# Patient Record
Sex: Male | Born: 1980
Health system: Southern US, Community
[De-identification: ages and names within clinical notes are randomized; demographics above are authoritative.]

## PROBLEM LIST (undated history)

## (undated) DIAGNOSIS — T7840XA Allergy, unspecified, initial encounter: Secondary | ICD-10-CM

## (undated) DIAGNOSIS — F909 Attention-deficit hyperactivity disorder, unspecified type: Secondary | ICD-10-CM

## (undated) HISTORY — PX: SHOULDER SURGERY: SHX246

## (undated) HISTORY — PX: APPENDECTOMY: SHX54

## (undated) HISTORY — DX: Allergy, unspecified, initial encounter: T78.40XA

## (undated) HISTORY — DX: Attention-deficit hyperactivity disorder, unspecified type: F90.9

---
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
## 2000-11-28 ENCOUNTER — Other Ambulatory Visit: Admission: RE | Admit: 2000-11-28 | Discharge: 2000-11-28 | Payer: Self-pay | Admitting: Otolaryngology

## 2003-07-26 ENCOUNTER — Encounter: Admission: RE | Admit: 2003-07-26 | Discharge: 2003-07-26 | Payer: Self-pay | Admitting: Sports Medicine

## 2004-11-02 ENCOUNTER — Encounter: Admission: RE | Admit: 2004-11-02 | Discharge: 2004-11-02 | Payer: Self-pay | Admitting: Sports Medicine

## 2014-02-14 ENCOUNTER — Ambulatory Visit (INDEPENDENT_AMBULATORY_CARE_PROVIDER_SITE_OTHER): Payer: BC Managed Care – PPO | Admitting: Emergency Medicine

## 2014-02-14 VITALS — BP 124/80 | HR 88 | Temp 98.1°F | Resp 16 | Ht 75.0 in | Wt 244.8 lb

## 2014-02-14 DIAGNOSIS — F988 Other specified behavioral and emotional disorders with onset usually occurring in childhood and adolescence: Secondary | ICD-10-CM

## 2014-02-14 MED ORDER — AMPHETAMINE-DEXTROAMPHETAMINE 30 MG PO TABS: 30.0000 mg | ORAL_TABLET | Freq: Two times a day (BID) | ORAL | Status: DC

## 2014-02-14 NOTE — Progress Notes (Signed)
[facility] [address], [place] [id number] [phone]  Date:  02/14/2014   Name:  Thomas DuelSteven Bowers   DOB:  September 21, 1980   MRN:  [id number]  PCP:  No primary provider on file.    Chief Complaint: Medication Refill   History of Present Illness:  Thomas DuelSteven Cordial is a 33 y.o. very pleasant male patient who presents with the following:  Patient has a history of ADD, diagnosed in 2003 while an undergraduate.  Records of his learning disability evaluation are reviewed.  [place] has [place] from [place] and is requesting a refill on his adderall.  He is stable on the dose of 30 BID and has no adverse effect of treatment. He is eating and sleeping with no difficulty and maintaining his weight. No improvement with over the counter medications or other home remedies. Denies other complaint or health concern today.   There are no active problems to display for this patient.   Past Medical History  Diagnosis Date  . Allergy     History reviewed. No pertinent past surgical history.  History  Substance Use Topics  . Smoking status: Never Smoker   . Smokeless tobacco: Not on file  . Alcohol Use: Yes     Comment: occasional beer    History reviewed. No pertinent family history.  No Known Allergies  Medication list has been reviewed and updated.  No current outpatient prescriptions on file prior to visit.   No current facility-administered medications on file prior to visit.    Review of Systems:  As per HPI, otherwise negative.    Physical Examination: Filed Vitals:   02/14/14 1525  BP: 124/80  Pulse: 88  Temp: 98.1 F (36.7 C)  Resp: 16   Filed Vitals:   02/14/14 1525  Height: 6\' 3"  (1.905 m)  Weight: 244 lb 12.8 oz (111.041 kg)   Body mass index is 30.6 kg/(m^2). Ideal Body Weight: Weight in (lb) to have BMI = 25: 199.6  GEN: WDWN, NAD, Non-toxic, A & O x 3 HEENT: Atraumatic, Normocephalic. Neck supple. No masses, No LAD. Ears and Nose: No  external deformity. CV: RRR, No M/G/R. No JVD. No thrill. No extra heart sounds. PULM: CTA B, no wheezes, crackles, rhonchi. No retractions. No resp. distress. No accessory muscle use. ABD: S, NT, ND, +BS. No rebound. No HSM. EXTR: No c/c/e NEURO Normal gait.  PSYCH: Normally interactive. Conversant. Not depressed or anxious appearing.  Calm demeanor.    Assessment and Plan: ADD One month refill pending record review   Signed,  Phillips OdorJeffery Jhana Giarratano, MD

## 2014-02-14 NOTE — Patient Instructions (Signed)
Add Attention Deficit Hyperactivity Disorder Attention deficit hyperactivity disorder (ADHD) is a problem with behavior issues based on the way the brain functions (neurobehavioral disorder). It is a common reason for behavior and academic problems in school. SYMPTOMS  There are 3 types of ADHD. The 3 types and some of the symptoms include:  Inattentive.  Gets bored or distracted easily.  Loses or forgets things. Forgets to hand in homework.  Has trouble organizing or completing tasks.  Difficulty staying on task.  An inability to organize daily tasks and school work.  Leaving projects, chores, or homework unfinished.  Trouble paying attention or responding to details. Careless mistakes.  Difficulty following directions. Often seems like is not listening.  Dislikes activities that require sustained attention (like chores or homework).  Hyperactive-impulsive.  Feels like it is impossible to sit still or stay in a seat. Fidgeting with hands and feet.  Trouble waiting turn.  Talking too much or out of turn. Interruptive.  Speaks or acts impulsively.  Aggressive, disruptive behavior.  Constantly busy or on the go; noisy.  Often leaves seat when they are expected to remain seated.  Often runs or climbs where it is not appropriate, or feels very restless.  Combined.  Has symptoms of both of the above. Often children with ADHD feel discouraged about themselves and with school. They often perform well below their abilities in school. As children get older, the excess motor activities can calm down, but the problems with paying attention and staying organized persist. Most children do not outgrow ADHD but with good treatment can learn to cope with the symptoms. DIAGNOSIS  When ADHD is suspected, the diagnosis should be made by professionals trained in ADHD. This professional will collect information about the individual suspected of having ADHD. Information must be collected  from various settings where the person lives, works, or attends school.  Diagnosis will include:  Confirming symptoms began in childhood.  Ruling out other reasons for the child's behavior.  The health care providers will check with the child's school and check their medical records.  They will talk to teachers and parents.  Behavior rating scales for the child will be filled out by those dealing with the child on a daily basis. A diagnosis is made only after all information has been considered. TREATMENT  Treatment usually includes behavioral treatment, tutoring or extra support in school, and stimulant medicines. Because of the way a person's brain works with ADHD, these medicines decrease impulsivity and hyperactivity and increase attention. This is different than how they would work in a person who does not have ADHD. Other medicines used include antidepressants and certain blood pressure medicines. Most experts agree that treatment for ADHD should address all aspects of the person's functioning. Along with medicines, treatment should include structured classroom management at school. Parents should reward good behavior, provide constant discipline, and set limits. Tutoring should be available for the child as needed. ADHD is a lifelong condition. If untreated, the disorder can have long-term serious effects into adolescence and adulthood. HOME CARE INSTRUCTIONS   Often with ADHD there is a lot of frustration among family members dealing with the condition. Blame and anger are also feelings that are common. In many cases, because the problem affects the family as a whole, the entire family may need help. A therapist can help the family find better ways to handle the disruptive behaviors of the person with ADHD and promote change. If the person with ADHD is young, most of the  therapist's work is with the parents. Parents will learn techniques for coping with and improving their child's  behavior. Sometimes only the child with the ADHD needs counseling. Your health care providers can help you make these decisions.  Children with ADHD may need help learning how to organize. Some helpful tips include:  Keep routines the same every day from wake-up time to bedtime. Schedule all activities, including homework and playtime. Keep the schedule in a place where the person with ADHD will often see it. Mark schedule changes as far in advance as possible.  Schedule outdoor and indoor recreation.  Have a place for everything and keep everything in its place. This includes clothing, backpacks, and school supplies.  Encourage writing down assignments and bringing home needed books. Work with your child's teachers for assistance in organizing school work.  Offer your child a well-balanced diet. Breakfast that includes a balance of whole grains, protein, and fruits or vegetables is especially important for school performance. Children should avoid drinks with caffeine including:  Soft drinks.  Coffee.  Tea.  However, some older children (adolescents) may find these drinks helpful in improving their attention. Because it can also be common for adolescents with ADHD to become addicted to caffeine, talk with your health care provider about what is a safe amount of caffeine intake for your child.  Children with ADHD need consistent rules that they can understand and follow. If rules are followed, give small rewards. Children with ADHD often receive, and expect, criticism. Look for good behavior and praise it. Set realistic goals. Give clear instructions. Look for activities that can foster success and self-esteem. Make time for pleasant activities with your child. Give lots of affection.  Parents are their children's greatest advocates. Learn as much as possible about ADHD. This helps you become a stronger and better advocate for your child. It also helps you educate your child's teachers and  instructors if they feel inadequate in these areas. Parent support groups are often helpful. A national group with local chapters is called Children and Adults with Attention Deficit Hyperactivity Disorder (CHADD). SEEK MEDICAL CARE IF:  Your child has repeated muscle twitches, cough, or speech outbursts.  Your child has sleep problems.  Your child has a marked loss of appetite.  Your child develops depression.  Your child has new or worsening behavioral problems.  Your child develops dizziness.  Your child has a racing heart.  Your child has stomach pains.  Your child develops headaches. SEEK IMMEDIATE MEDICAL CARE IF:  Your child has been diagnosed with depression or anxiety and the symptoms seem to be getting worse.  Your child has been depressed and suddenly appears to have increased energy or motivation.  You are worried that your child is having a bad reaction to a medication he or she is taking for ADHD. Document Released: 06/25/2002 Document Revised: 07/10/2013 Document Reviewed: 03/12/2013 [facility] Patient Information 2015 [employer] [place]. This information is not intended to replace advice given to you by your health care provider. Make sure you discuss any questions you have with your health care provider.

## 2014-04-04 ENCOUNTER — Ambulatory Visit (INDEPENDENT_AMBULATORY_CARE_PROVIDER_SITE_OTHER): Payer: BC Managed Care – PPO | Admitting: Family Medicine

## 2014-04-04 VITALS — BP 138/90 | HR 73 | Temp 97.8°F | Resp 16 | Ht 75.0 in | Wt 247.8 lb

## 2014-04-04 DIAGNOSIS — F988 Other specified behavioral and emotional disorders with onset usually occurring in childhood and adolescence: Secondary | ICD-10-CM

## 2014-04-04 MED ORDER — AMPHETAMINE-DEXTROAMPHETAMINE 30 MG PO TABS: 30.0000 mg | ORAL_TABLET | Freq: Two times a day (BID) | ORAL | Status: DC

## 2014-04-04 NOTE — Patient Instructions (Signed)
Good to see you today- please come and see [place] in about 3 months when you are due for further refills

## 2014-04-04 NOTE — Progress Notes (Signed)
[facility] [address], [place] [address] [phone]  Date:  04/04/2014   Name:  Thomas Bowers   DOB:  02-Jun-1981   MRN:  [id number]  PCP:  No primary provider on file.    Chief Complaint: Medication Refill   History of Present Illness:  Thomas Bowers is a 33 y.o. very pleasant male patient who presents with the following:  He has been on adderall for many years, since he was in college.  He feels well with his current dose of medication, he is able to eat and sleep normally.  Would like a refill today.  Otherwise he is generally healthy and has no other concerns   There are no active problems to display for this patient.   Past Medical History  Diagnosis Date  . Allergy     History reviewed. No pertinent past surgical history.  History  Substance Use Topics  . Smoking status: Never Smoker   . Smokeless tobacco: Not on file  . Alcohol Use: Yes     Comment: occasional beer    History reviewed. No pertinent family history.  No Known Allergies  Medication list has been reviewed and updated.  Current Outpatient Prescriptions on File Prior to Visit  Medication Sig Dispense Refill  . amphetamine-dextroamphetamine (ADDERALL) 30 MG tablet Take 1 tablet (30 mg total) by mouth 2 (two) times daily.  60 tablet  0   No current facility-administered medications on file prior to visit.    Review of Systems:  As per HPI- otherwise negative.   Physical Examination: Filed Vitals:   04/04/14 1656  BP: 138/90  Pulse: 73  Temp: 97.8 F (36.6 C)  Resp: 16   Filed Vitals:   04/04/14 1656  Height:  (1.905 m)  Weight: 247 lb 12.8 oz (112.401 kg)   Body mass index is 30.97 kg/(m^2). Ideal Body Weight: Weight in (lb) to have BMI = 25: 199.6  GEN: WDWN, NAD, Non-toxic, A & O x 3, looks well, overweight HEENT: Atraumatic, Normocephalic. Neck supple. No masses, No LAD. Ears and Nose: No external deformity. CV: RRR, No M/G/R. No JVD. No  thrill. No extra heart sounds. PULM: CTA B, no wheezes, crackles, rhonchi. No retractions. No resp. distress. No accessory muscle use. EXTR: No c/c/e NEURO Normal gait.  PSYCH: Normally interactive. Conversant. Not depressed or anxious appearing.  Calm demeanor.    Assessment and Plan: ADD (attention deficit disorder) - Plan: amphetamine-dextroamphetamine (ADDERALL) 30 MG tablet, amphetamine-dextroamphetamine (ADDERALL) 30 MG tablet, amphetamine-dextroamphetamine (ADDERALL) 30 MG tablet  Refilled his adderall today.  He signed a release to have his old MD records sent to use but I do not see these yet.  Will look again when he comes to see [place] in 3 months    Signed Abbe Amsterdam, MD

## 2014-08-27 ENCOUNTER — Ambulatory Visit (INDEPENDENT_AMBULATORY_CARE_PROVIDER_SITE_OTHER): Payer: BLUE CROSS/BLUE SHIELD | Admitting: Physician Assistant

## 2014-08-27 ENCOUNTER — Telehealth: Payer: Self-pay | Admitting: Radiology

## 2014-08-27 VITALS — BP 142/88 | HR 94 | Temp 98.4°F | Resp 20 | Ht 75.5 in | Wt 264.5 lb

## 2014-08-27 DIAGNOSIS — F988 Other specified behavioral and emotional disorders with onset usually occurring in childhood and adolescence: Secondary | ICD-10-CM | POA: Insufficient documentation

## 2014-08-27 DIAGNOSIS — F909 Attention-deficit hyperactivity disorder, unspecified type: Secondary | ICD-10-CM

## 2014-08-27 MED ORDER — AMPHETAMINE-DEXTROAMPHETAMINE 30 MG PO TABS: 30.0000 mg | ORAL_TABLET | Freq: Two times a day (BID) | ORAL | Status: DC

## 2014-08-27 NOTE — Progress Notes (Signed)
Subjective:    Patient ID: Thomas Bowers, male    DOB: 21-Oct-1980, 34 y.o.   MRN: [id number]  HPI  This is a 34 year old male presenting for ADD follow up.  He has been on adderall 30 mg BID x 10 years. He doesn't take it on weekends. This dose works well for him. He is not having any insomnia, decreased appetite, or palpitations. He was to have the medical office that did formal testing on him 10 years ago send in the paperwork. He has tried to get this done twice but we still do not have records.  4-5 months ago [place]. 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 His wife is an Associate Professororthodontist. His mood is good, no anxiety.  Review of Systems  Constitutional: Negative for fever, chills and appetite change.  Cardiovascular: Negative for chest pain and palpitations.  Skin: Negative for rash.  Psychiatric/Behavioral: Negative for sleep disturbance. The patient is not nervous/anxious.    Patient Active Problem List   Diagnosis Date Noted  . ADD (attention deficit disorder) 08/27/2014   Prior to Admission medications   Medication Sig Start Date End Date Taking? Authorizing Provider  amphetamine-dextroamphetamine (ADDERALL) 30 MG tablet Take 1 tablet by mouth 2 (two) times daily. 08/27/14   Dorna LeitzNicole Hila Bolding V, PA-C  amphetamine-dextroamphetamine (ADDERALL) 30 MG tablet Take 1 tablet by mouth 2 (two) times daily. To fill 30 days after rx 08/27/14   Dorna LeitzNicole Tais Koestner V, PA-C  amphetamine-dextroamphetamine (ADDERALL) 30 MG tablet Take 1 tablet by mouth 2 (two) times daily. To fill 60 days after rx 08/27/14   Dorna LeitzNicole Dhillon [place] V, PA-C   No Known Allergies  Patient's social and family history were reviewed.     Objective:   Physical Exam  Constitutional: He is oriented to person, place, and time. He appears well-developed and well-nourished. No distress.  HENT:  Head: Normocephalic and atraumatic.  Right Ear: Hearing normal.  Left Ear: Hearing normal.  Nose:  Nose normal.  Eyes: Conjunctivae and lids are normal. Right eye exhibits no discharge. Left eye exhibits no discharge. No scleral icterus.  Cardiovascular: Normal rate, regular rhythm, normal heart sounds, intact distal pulses and normal pulses.   No murmur heard. Pulmonary/Chest: Effort normal and breath sounds normal. No respiratory distress. He has no wheezes. He has no rhonchi. He has no rales.  Musculoskeletal: Normal range of motion.  Neurological: He is alert and oriented to person, place, and time.  Skin: Skin is warm, dry and intact. No lesion and no rash noted.  Psychiatric: He has a normal mood and affect. His speech is normal and behavior is normal. Thought content normal.   BP 142/88 mmHg  Pulse 94  Temp(Src) 98.4 F (36.9 C) (Oral)  Resp 20  Ht 6' 3.5" (1.918 m)  Wt 264 lb 8 oz (119.976 kg)  BMI 32.61 kg/m2  SpO2 99%     Assessment & Plan:  1. ADD (attention deficit disorder) Refilled three months of adderall. We discussed if he wishes to establish care here he should establish with one provider. He should make an appt or call to see when one of us is working. He will return in 3 months for follow up.  - amphetamine-dextroamphetamine (ADDERALL) 30 MG tablet; Take 1 tablet by mouth 2 (two) times daily.  Dispense: 60 tablet; Refill: 0 - amphetamine-dextroamphetamine (ADDERALL) 30 MG tablet; Take 1 tablet by mouth 2 (two) times daily. To fill 30  days after rx  Dispense: 60 tablet; Refill: 0 - amphetamine-dextroamphetamine (ADDERALL) 30 MG tablet; Take 1 tablet by mouth 2 (two) times daily. To fill 60 days after rx  Dispense: 60 tablet; Refill: 0   Roswell Miners. Dyke Brackett, MHS [facility]  08/27/2014

## 2014-08-27 NOTE — Patient Instructions (Signed)
Return in 3 months for follow-up  

## 2014-08-27 NOTE — Telephone Encounter (Signed)
Medical records- please try to find this patient's [facility]  He said he filled out a record release in 2015 and these documents should have been sent, I could not locate them in his chart or the "to be scanned" boxes.  The office notified him that the records had been faxed to us.  The office Phone numbers are #[phone], [phone], and [phone] if you need to reach them.  We need his ADD eval.   When found, please scan immediately.

## 2014-08-28 NOTE — Telephone Encounter (Signed)
Medical records does not have these records, nor do we have record of the request. He can have that office resend the records.

## 2014-08-28 NOTE — Progress Notes (Signed)
  Medical screening examination/treatment/procedure(s) were performed by non-physician practitioner and as supervising physician I was immediately available for consultation/collaboration.     

## 2014-08-31 NOTE — Telephone Encounter (Signed)
Left message on machine to call back  

## 2014-09-03 NOTE — Telephone Encounter (Signed)
Thomas Bowers still nothing on this pt?

## 2014-09-04 NOTE — Telephone Encounter (Signed)
Left message for [facility] to call back to see if they can resend evaluation.

## 2014-09-04 NOTE — Telephone Encounter (Signed)
Spoke with a lady from [facility] and she states he had his evaluation in 1997 and she would have to contct storage to release the paperwork to us. She states she is going to work on this and get back to me. It looks like Dr. Patsy Lageropland requested these records so we can have them in our file.

## 2014-09-04 NOTE — Telephone Encounter (Signed)
The only thing I've seen from 2015 is an [facility] that was scanned in Jan 2016. Is that what he's talking about?

## 2014-12-09 ENCOUNTER — Other Ambulatory Visit: Payer: Self-pay | Admitting: *Deleted

## 2014-12-09 ENCOUNTER — Ambulatory Visit
Admission: RE | Admit: 2014-12-09 | Discharge: 2014-12-09 | Disposition: A | Discharge status: Home / Self Care | Payer: BLUE CROSS/BLUE SHIELD | Source: Ambulatory Visit | Attending: *Deleted | Admitting: *Deleted

## 2014-12-09 DIAGNOSIS — R1031 Right lower quadrant pain: Secondary | ICD-10-CM

## 2014-12-09 MED ORDER — IOHEXOL 300 MG/ML  SOLN
125.0000 mL | Freq: Once | INTRAMUSCULAR | Status: AC | PRN
  Administered 2014-12-09: 125 mL via INTRAVENOUS

## 2015-01-09 ENCOUNTER — Ambulatory Visit (INDEPENDENT_AMBULATORY_CARE_PROVIDER_SITE_OTHER): Payer: BLUE CROSS/BLUE SHIELD | Admitting: Emergency Medicine

## 2015-01-09 VITALS — BP 110/86 | HR 75 | Temp 98.5°F | Resp 16 | Ht 75.5 in | Wt 263.6 lb

## 2015-01-09 DIAGNOSIS — F988 Other specified behavioral and emotional disorders with onset usually occurring in childhood and adolescence: Secondary | ICD-10-CM

## 2015-01-09 DIAGNOSIS — F909 Attention-deficit hyperactivity disorder, unspecified type: Secondary | ICD-10-CM

## 2015-01-09 MED ORDER — AMPHETAMINE-DEXTROAMPHETAMINE 30 MG PO TABS: 30.0000 mg | ORAL_TABLET | Freq: Two times a day (BID) | ORAL | Status: DC

## 2015-01-09 MED ORDER — AMPHETAMINE-DEXTROAMPHETAMINE 30 MG PO TABS: 30.0000 mg | ORAL_TABLET | Freq: Two times a day (BID) | ORAL | Status: AC

## 2015-01-09 NOTE — Patient Instructions (Signed)
Amphetamine; Dextroamphetamine extended-release capsules What is this medicine? AMPHETAMINE; DEXTROAMPHETAMINE (am FET a meen; dex troe am FET a meen) is used to treat attention-deficit hyperactivity disorder (ADHD). Federal law prohibits giving this medicine to any person other than the person for whom it was prescribed. Do not share this medicine with anyone else. This medicine may be used for other purposes; ask your health care provider or pharmacist if you have questions. COMMON BRAND NAME(S): Adderall XR What should I tell my health care provider before I take this medicine? They need to know if you have any of these conditions: -anxiety or panic attacks -circulation problems in fingers and toes -glaucoma -hardening or blockages of the arteries or heart blood vessels -heart disease or a heart defect -high blood pressure -history of a drug or alcohol abuse problem -history of stroke -kidney disease -liver disease -mental illness -seizures -suicidal thoughts, plans, or attempt; a previous suicide attempt by you or a family member -thyroid disease -Tourette's syndrome -an unusual or allergic reaction to dextroamphetamine, other amphetamines, other medicines, foods, dyes, or preservatives -pregnant or trying to get pregnant -breast-feeding How should I use this medicine? Take this medicine by mouth with a glass of water. Follow the directions on the prescription label. This medicine is taken just one time per day, usually in the morning after waking up. Take with or without food. Do not chew or crush this medicine. You may open the capsules and sprinkle the medicine onto a spoon of applesauce. If sprinkled on applesauce, take the dose immediately and do not crush or chew. Always drink a glass of water or other liquid after taking this medicine. Do not take your medicine more often than directed. A special MedGuide will be given to you by the pharmacist with each prescription and refill.  Be sure to read this information carefully each time. Talk to your pediatrician regarding the use of this medicine in children. While this drug may be prescribed for children as young as 6 years for selected conditions, precautions do apply. Overdosage: If you think you have taken too much of this medicine contact a poison control center or emergency room at once. NOTE: This medicine is only for you. Do not share this medicine with others. What if I miss a dose? If you miss a dose, take it as soon as you can. If it is almost time for your next dose, take only that dose. Do not take double or extra doses. What may interact with this medicine? Do not take this medicine with any of the following medications: -certain medicines for migraine headache like almotriptan, eletriptan, frovatriptan, naratriptan, rizatriptan, sumatriptan, zolmitriptan -MAOIs like Carbex, Eldepryl, Marplan, Nardil, and Parnate -meperidine -other stimulant medicines for attention disorders, weight loss, or to stay awake -pimozide -procarbazine This medicine may also interact with the following medications: -acetazolamide -ammonium chloride -antacids -ascorbic acid -atomoxetine -caffeine -certain medicines for blood pressure -certain medicines for depression, anxiety, or psychotic disturbances -certain medicines for diabetes -certain medicines for seizures like carbamazepine, phenobarbital, phenytoin -certain medicines for stomach problems like cimetidine, famotidine, omeprazole, lansoprazole -cold or allergy medicines -glutamic acid -methenamine -narcotic medicines for pain -norepinephrine -phenothiazines like chlorpromazine, mesoridazine, prochlorperazine, thioridazine -sodium acid phosphate -sodium bicarbonate This list may not describe all possible interactions. Give your health care provider a list of all the medicines, herbs, non-prescription drugs, or dietary supplements you use. Also tell them if you  smoke, drink alcohol, or use illegal drugs. Some items may interact with your medicine. What should   I watch for while using this medicine? Visit your doctor or health care professional for regular checks on your progress. This prescription requires that you follow special procedures with your doctor and pharmacy. You will need to have a new written prescription from your doctor every time you need a refill. This medicine may affect your concentration, or hide signs of tiredness. Until you know how this medicine affects you, do not drive, ride a bicycle, use machinery, or do anything that needs mental alertness. Tell your doctor or health care professional if this medicine loses its effects, or if you feel you need to take more than the prescribed amount. Do not change the dosage without talking to your doctor or health care professional. Decreased appetite is a common side effect when starting this medicine. Eating small, frequent meals or snacks can help. Talk to your doctor if you continue to have poor eating habits. Height and weight growth of a child taking this medicine will be monitored closely. Do not take this medicine close to bedtime. It may prevent you from sleeping. If you are going to need surgery, an MRI, a CT scan, or other procedure, tell your doctor that you are taking this medicine. You may need to stop taking this medicine before the procedure. Tell your doctor or healthcare professional right away if you notice unexplained wounds on your fingers and toes while taking this medicine. You should also tell your healthcare provider if you experience numbness or pain, changes in the skin color, or sensitivity to temperature in your fingers or toes. What side effects may I notice from receiving this medicine? Side effects that you should report to your doctor or health care professional as soon as possible: -allergic reactions like skin rash, itching or hives, swelling of the face, lips, or  tongue -changes in vision -chest pain or chest tightness -confusion, trouble speaking or understanding -fast, irregular heartbeat -fingers or toes feel numb, cool, painful -hallucination, loss of contact with reality -high blood pressure -males: prolonged or painful erection -seizures -severe headaches -shortness of breath -suicidal thoughts or other mood changes -trouble walking, dizziness, loss of balance or coordination -uncontrollable head, mouth, neck, arm, or leg movements Side effects that usually do not require medical attention (report to your doctor or health care professional if they continue or are bothersome): -anxious -headache -loss of appetite -nausea, vomiting -trouble sleeping -weight loss This list may not describe all possible side effects. Call your doctor for medical advice about side effects. You may report side effects to FDA at 1-800-FDA-1088. Where should I keep my medicine? Keep out of the reach of children. This medicine can be abused. Keep your medicine in a safe place to protect it from theft. Do not share this medicine with anyone. Selling or giving away this medicine is dangerous and against the law. Store at room temperature between 15 and 30 degrees C (59 and 86 degrees F). Keep container tightly closed. Protect from light. Throw away any unused medicine after the expiration date. NOTE: This sheet is a summary. It may not cover all possible information. If you have questions about this medicine, talk to your doctor, pharmacist, or health care provider.  2015, Elsevier/Gold Standard. (2013-05-18 18:16:00)  

## 2015-01-09 NOTE — Progress Notes (Signed)
Subjective:  Patient ID: Thomas Bowers, male    DOB: 03/08/1981  Age: 34 y.o. MRN: [id number]  CC: Medication Refill and Follow-up   HPI Thomas Bowers presents  for refill of his Adderall. Has been taking now for about 6 months is doing well with the current dose. His eating and sleeping well is maintaining his weight. His symptoms of ADD  are well-controlled. He has requested his medical records be sent here on 1 prior occasion they've not yet arrived.  History Thomas Bowers has a past medical history of Allergy.   He has past surgical history that includes Shoulder surgery (Left) and Appendectomy.   His  family history is not on file.  He   reports that he has never smoked. He does not have any smokeless tobacco history on file. He reports that he drinks alcohol. He reports that he does not use illicit drugs.  Outpatient Prescriptions Prior to Visit  Medication Sig Dispense Refill  . amphetamine-dextroamphetamine (ADDERALL) 30 MG tablet Take 1 tablet by mouth 2 (two) times daily. 60 tablet 0  . amphetamine-dextroamphetamine (ADDERALL) 30 MG tablet Take 1 tablet by mouth 2 (two) times daily. To fill 30 days after rx (Patient not taking: Reported on 01/09/2015) 60 tablet 0  . amphetamine-dextroamphetamine (ADDERALL) 30 MG tablet Take 1 tablet by mouth 2 (two) times daily. To fill 60 days after rx (Patient not taking: Reported on 01/09/2015) 60 tablet 0   No facility-administered medications prior to visit.    History   Social History  . Marital Status: Married    Spouse Name: N/A  . Number of Children: N/A  . Years of Education: N/A   Social History Main Topics  . Smoking status: Never Smoker   . Smokeless tobacco: Not on file  . Alcohol Use: Yes     Comment: occasional beer  . Drug Use: No  . Sexual Activity: Not on file   Other Topics Concern  . None   Social History Narrative     Review of Systems  Constitutional: Negative for fever, chills and appetite change.  HENT:  Negative for congestion, ear pain, postnasal drip, sinus pressure and sore throat.   Eyes: Negative for pain and redness.  Respiratory: Negative for cough, shortness of breath and wheezing.   Cardiovascular: Negative for leg swelling.  Gastrointestinal: Negative for nausea, vomiting, abdominal pain, diarrhea, constipation and blood in stool.  Endocrine: Negative for polyuria.  Genitourinary: Negative for dysuria, urgency, frequency and flank pain.  Musculoskeletal: Negative for gait problem.  Skin: Negative for rash.  Neurological: Negative for weakness and headaches.  Psychiatric/Behavioral: Negative for confusion and decreased concentration. The patient is not nervous/anxious.     Objective:  BP 110/86 mmHg  Pulse 75  Temp(Src) 98.5 F (36.9 C) (Oral)  Resp 16  Ht 6' 3.5" (1.918 m)  Wt 263 lb 9.6 oz (119.568 kg)  BMI 32.50 kg/m2  SpO2 98%  Physical Exam  Constitutional: He is oriented to person, place, and time. He appears well-developed and well-nourished. No distress.  HENT:  Head: Normocephalic and atraumatic.  Right Ear: External ear normal.  Left Ear: External ear normal.  Nose: Nose normal.  Eyes: Conjunctivae and EOM are normal. Pupils are equal, round, and reactive to light. No scleral icterus.  Neck: Normal range of motion. Neck supple. No tracheal deviation present.  Cardiovascular: Normal rate, regular rhythm and normal heart sounds.   Pulmonary/Chest: Effort normal. No respiratory distress. He has no wheezes. He has no rales.  Abdominal: He exhibits no mass. There is no tenderness. There is no rebound and no guarding.  Musculoskeletal: He exhibits no edema.  Lymphadenopathy:    He has no cervical adenopathy.  Neurological: He is alert and oriented to person, place, and time. Coordination normal.  Skin: Skin is warm and dry. No rash noted.  Psychiatric: He has a normal mood and affect. His behavior is normal.      Assessment & Plan:   Thomas Bowers was seen today  for medication refill and follow-up.  Diagnoses and all orders for this visit:  ADD (attention deficit disorder) Orders: -     amphetamine-dextroamphetamine (ADDERALL) 30 MG tablet; Take 1 tablet by mouth 2 (two) times daily. To fill 60 days after rx -     amphetamine-dextroamphetamine (ADDERALL) 30 MG tablet; Take 1 tablet by mouth 2 (two) times daily. To fill 30 days after rx -     amphetamine-dextroamphetamine (ADDERALL) 30 MG tablet; Take 1 tablet by mouth 2 (two) times daily.   I am having Thomas Bowers maintain his amphetamine-dextroamphetamine, amphetamine-dextroamphetamine, and amphetamine-dextroamphetamine.  Meds ordered this encounter  Medications  . amphetamine-dextroamphetamine (ADDERALL) 30 MG tablet    Sig: Take 1 tablet by mouth 2 (two) times daily. To fill 60 days after rx    Dispense:  60 tablet    Refill:  0  . amphetamine-dextroamphetamine (ADDERALL) 30 MG tablet    Sig: Take 1 tablet by mouth 2 (two) times daily. To fill 30 days after rx    Dispense:  60 tablet    Refill:  0  . amphetamine-dextroamphetamine (ADDERALL) 30 MG tablet    Sig: Take 1 tablet by mouth 2 (two) times daily.    Dispense:  60 tablet    Refill:  0   I explained the office policy on controlled substances and instructed him that if we cannot obtain medical records substantiating his need for Adderall were not going to continue to prescribe it.  Appropriate red flag conditions were discussed with the patient as well as actions that should be taken.  Patient expressed his understanding.  Follow-up: Return in about 3 months (around 04/11/2015).  Carmelina Dane, MD

## 2015-01-21 ENCOUNTER — Ambulatory Visit: Payer: BLUE CROSS/BLUE SHIELD | Admitting: Family Medicine

## 2015-07-28 ENCOUNTER — Other Ambulatory Visit: Payer: Self-pay | Admitting: *Deleted

## 2015-07-28 ENCOUNTER — Ambulatory Visit
Admission: RE | Admit: 2015-07-28 | Discharge: 2015-07-28 | Disposition: A | Discharge status: Home / Self Care | Payer: BLUE CROSS/BLUE SHIELD | Source: Ambulatory Visit | Attending: *Deleted | Admitting: *Deleted

## 2015-07-28 DIAGNOSIS — W19XXXA Unspecified fall, initial encounter: Secondary | ICD-10-CM

## 2016-07-19 DIAGNOSIS — B338 Other specified viral diseases: Secondary | ICD-10-CM | POA: Diagnosis not present

## 2017-01-14 ENCOUNTER — Other Ambulatory Visit: Payer: Self-pay | Admitting: Family Medicine

## 2017-01-14 ENCOUNTER — Ambulatory Visit
Admission: RE | Admit: 2017-01-14 | Discharge: 2017-01-14 | Disposition: A | Discharge status: Home / Self Care | Payer: BLUE CROSS/BLUE SHIELD | Source: Ambulatory Visit | Attending: Family Medicine | Admitting: Family Medicine

## 2017-01-14 DIAGNOSIS — S99922A Unspecified injury of left foot, initial encounter: Secondary | ICD-10-CM | POA: Diagnosis not present

## 2017-01-14 DIAGNOSIS — M79672 Pain in left foot: Secondary | ICD-10-CM

## 2017-05-19 HISTORY — PX: TRANSTHORACIC ECHOCARDIOGRAM: SHX275

## 2017-05-23 ENCOUNTER — Other Ambulatory Visit: Payer: Self-pay | Admitting: Family Medicine

## 2017-05-23 ENCOUNTER — Ambulatory Visit
Admission: RE | Admit: 2017-05-23 | Discharge: 2017-05-23 | Disposition: A | Discharge status: Home / Self Care | Payer: BLUE CROSS/BLUE SHIELD | Source: Ambulatory Visit | Attending: Family Medicine | Admitting: Family Medicine

## 2017-05-23 DIAGNOSIS — R0602 Shortness of breath: Secondary | ICD-10-CM | POA: Diagnosis not present

## 2017-05-23 DIAGNOSIS — R0600 Dyspnea, unspecified: Secondary | ICD-10-CM

## 2017-05-25 ENCOUNTER — Ambulatory Visit: Payer: BLUE CROSS/BLUE SHIELD | Admitting: Cardiology

## 2017-05-25 ENCOUNTER — Encounter: Payer: Self-pay | Admitting: Cardiology

## 2017-05-25 VITALS — BP 122/92 | HR 88 | Ht 75.5 in | Wt 259.4 lb

## 2017-05-25 DIAGNOSIS — R0602 Shortness of breath: Secondary | ICD-10-CM | POA: Insufficient documentation

## 2017-05-25 DIAGNOSIS — R079 Chest pain, unspecified: Secondary | ICD-10-CM | POA: Diagnosis not present

## 2017-05-25 DIAGNOSIS — R9431 Abnormal electrocardiogram [ECG] [EKG]: Secondary | ICD-10-CM | POA: Diagnosis not present

## 2017-05-25 NOTE — Progress Notes (Signed)
PCP: Ailene RavelHamrick, Maura L, MD  Clinic Note: Chief Complaint  Patient presents with  . New Patient (Initial Visit)  . Chest Pain    HPI: Thomas Bowers is a 36 y.o. male who is being seen today for the evaluation of chest pain and shortness of breath at the request of Hamrick, Durward FortesMaura L, MD.  Thomas Bowers was last seen on a couple occasions here in early November by his PCP.  Was seen on the fifth and 6 November.  Initially came in with a couple weeks of intermittent spells of tightness across the lower chest radiating to both legs associated with significant dyspnea.  He also notes palpitations.  Recent Hospitalizations: None  Studies Personally Reviewed - (if available, images/films reviewed: From Epic Chart or Care Everywhere)  EKG from PCP shows sinus rhythm with a rate of 72 bpm.  Cannot exclude inferior and potentially anterior infarct, age undetermined.  Otherwise normal EKG.  Another EKG suggests just the inferior infarct but otherwise stable.  Interval History: Thomas Bowers is a relatively healthy gentleman who is on Adderall for attention deficit but otherwise no real other history.  No family history of coronary disease.  He presents with episodes of tightness and squeezing in the entire chest associated with shortness of breath.  He feels occasionally a sensation of his heart beating fast and irregular but just for short little spells.  What he is noticed over the last week with the most notable symptom being last Wednesday that he has a severe episode of pain and it lasts several minutes and then fades away with them will come back again.  Not associated with nausea, but he does feel like it he may start to feel nausea.  He describes it is, epigastric into the lower chest and says that it starts off as a dull ache and then occasionally is sharp.  He usually notes it at the end of the day.  When this is occurring he feels like he is in a fog feels very weak afterwards.  He feels flushed and  sweaty. Other than these few spells with only the one significant one last week, he is also noted profound dyspnea and some tightness in his chest when carrying his son who is a relatively young child.  He felt flushed and sweaty simply walking into the clinic room today and noticed to be short of breath.  This is a new finding for him and he is notably in distress.  He is not in respiratory distress, just seems to be concerned. He also has been noticing some headache off and on and some dizziness, but no syncope or near syncope. He is not noticing any resting dyspnea or PND, orthopnea.  No edema.  Although he feels some irregular heartbeats nothing prolonged to suggest an arrhythmia.  Unfortunately, nothing seems to really help the symptoms besides just transiently quiet.  He thought maybe it was indigestion, but those medicines have not helped. The other thing he notes that he is just been very tired of late -- not even a significant amount of sleep will make him feel better.  No melena, hematochezia, hematuria, or epstaxis. No claudication.    ROS: A comprehensive was performed.  Most symptoms that are pertinent are noted above. Review of Systems  Constitutional: Positive for diaphoresis (Quite sweaty today.  But mostly noted last Wednesday) and malaise/fatigue.  HENT: Negative for congestion, nosebleeds and sinus pain.   Respiratory: Positive for shortness of breath. Negative for cough, wheezing  and stridor.   Cardiovascular:       Per HPI  Gastrointestinal: Negative for blood in stool, heartburn and melena.  Genitourinary: Negative for dysuria, frequency, hematuria and urgency.  Musculoskeletal: Negative for falls, joint pain and myalgias.  Skin:       No real rash, but does feel flushed and very sweaty with minimal exertion --simply doing mild activities made him feel like he had a "sunburn"  Neurological: Positive for dizziness. Negative for weakness.  Endo/Heme/Allergies: Negative  for environmental allergies. Does not bruise/bleed easily.  Psychiatric/Behavioral: The patient is nervous/anxious.        Somewhat stressful job  All other systems reviewed and are negative.   I have reviewed and (if needed) personally updated the patient's problem list, medications, allergies, past medical and surgical history, social and family history.   Past Medical History:  Diagnosis Date  . Adult ADHD (attention deficit hyperactivity disorder)   . Allergy     Past Surgical History:  Procedure Laterality Date  . APPENDECTOMY    . SHOULDER SURGERY Left     Current Meds  Medication Sig  . Albuterol Sulfate 108 (90 Base) MCG/ACT AEPB Inhale 2 puffs as directed into the lungs.  Marland Kitchen amphetamine-dextroamphetamine (ADDERALL) 30 MG tablet Take 1 tablet by mouth 2 (two) times daily.  . cetirizine (ZYRTEC) 10 MG tablet Take 10 mg daily by mouth.  . Cholecalciferol (VITAMIN D-1000 MAX ST) 1000 units tablet Take 1 tablet daily by mouth.  . cyanocobalamin (TH VITAMIN B12) 100 MCG tablet Take 1 tablet daily by mouth.  . DOCOSAHEXAENOIC ACID PO Take 1 g daily by mouth.  Bartholome Bill NATURAL ZINC 50 MG TABS Take 1 tablet daily by mouth.  . folic acid (FOLVITE) 1 MG tablet Take 1 mg daily by mouth.  . pyridOXINE (B-6) 50 MG tablet Take 50 mg daily by mouth.  . vitamin E 200 UNIT capsule Take 1 capsule daily by mouth.    Allergies  Allergen Reactions  . Strawberry Extract Hives    Social History   Socioeconomic History  . Marital status: Married    Spouse name: None  . Number of children: None  . Years of education: None  . Highest education level: None  Social Needs  . Financial resource strain: None  . Food insecurity - worry: None  . Food insecurity - inability: None  . Transportation needs - medical: None  . Transportation needs - non-medical: None  Occupational History  . Occupation: HR Manager  Tobacco Use  . Smoking status: Never Smoker  . Smokeless tobacco: Never Used   Substance and Sexual Activity  . Alcohol use: Yes    Comment: occasional beer  . Drug use: No  . Sexual activity: Yes  Other Topics Concern  . None  Social History Narrative  . None    family history includes Arrhythmia in his mother; Rheumatic fever in his maternal grandmother.  Wt Readings from Last 3 Encounters:  05/25/17 259 lb 6.4 oz (117.7 kg)  01/09/15 263 lb 9.6 oz (119.6 kg)  08/27/14 264 lb 8 oz (120 kg)    PHYSICAL EXAM BP (!) 122/92   Pulse 88   Ht 6' 3.5" (1.918 m)   Wt 259 lb 6.4 oz (117.7 kg)   BMI 31.99 kg/m  Physical Exam  Constitutional: He is oriented to person, place, and time. He appears well-developed and well-nourished. No distress (No acute distress, just seems anxious and intense).  HENT:  Head: Normocephalic and atraumatic.  Mouth/Throat: No oropharyngeal exudate.  Eyes: EOM are normal. No scleral icterus.  Neck: Normal range of motion. Neck supple. No JVD present. Carotid bruit is not present. No tracheal deviation present. No thyromegaly present.  Cardiovascular: Normal rate, regular rhythm and normal pulses.  No extrasystoles are present. PMI is not displaced. Exam reveals distant heart sounds. Exam reveals no gallop.  No murmur heard. Pulmonary/Chest: Effort normal and breath sounds normal. No respiratory distress. He has no wheezes. He has no rales.  Abdominal: Bowel sounds are normal. He exhibits no distension. There is no tenderness. There is no rebound.  Musculoskeletal: Normal range of motion. He exhibits no edema or deformity.  Lymphadenopathy:    He has no cervical adenopathy.  Neurological: He is alert and oriented to person, place, and time. No cranial nerve deficit. Coordination normal.  Skin: Skin is warm and dry. No erythema.  Seems flushed and sweaty.  His face and hair is sweaty from just walking into the clinic  Psychiatric: He has a normal mood and affect. His behavior is normal. Judgment and thought content normal.  Quite  anxious  Nursing note and vitals reviewed.    Adult ECG Report  Rate: 88;  Rhythm: normal sinus rhythm and Inferior Q waves in lead III are more prominent than in aVF, cannot exclude inferior MI, age undetermined.  Also poor R wave progression, cannot rule out anterior MI, age undetermined; otherwise normal axis, intervals and durations and relatively stable EKG.  Narrative Interpretation: Stable when compared to PCP clinic EKGs.   Other studies Reviewed: Additional studies/ records that were reviewed today include:  Recent Labs: 05/24/2017  Na+ 142, K+ 4.2, Cl-   105, HCO3-   23, BUN 15, Cr 1.03, Glu 80, Ca2+ 9.0; AST 17, ALT 27, AlkP 61  CBC: W 5.4, H/H 16.4/47.2, Plt 220;; TSH 1.43.  A1c 4.9  TC 187, TG 141, HDL 41, LDL 118 (not fasting)   ASSESSMENT / PLAN: Problem List Items Addressed This Visit    Abnormal EKG    EKG with probably benign findings to suggest inferior Q waves.  He is concerned, and rightfully so I think is reasonable to check a 2D echo just to confirm no wall motion normality's.      Relevant Orders   ECHOCARDIOGRAM COMPLETE   ECHOCARDIOGRAM STRESS TEST   EKG 12-Lead   Chest pain with moderate risk for cardiac etiology - Primary    Some quite odd symptoms.  In way there were quite classic the other conglomeration of symptoms and just the sudden onset without any real risk factors in a young gentleman very unusual. Since he is noticing some dyspnea associated with the cardiogram with chest discomfort, I would like to check a 2D echo and then do echo stress test to evaluate for any wall motion normality with exertion.        Relevant Orders   ECHOCARDIOGRAM COMPLETE   ECHOCARDIOGRAM STRESS TEST   EKG 12-Lead   Shortness of breath    Shortness breath with exertion could potentially be related to ischemic etiology.    Plan: Check 2D echocardiogram as well as echo stress test.      Relevant Orders   ECHOCARDIOGRAM COMPLETE   ECHOCARDIOGRAM STRESS TEST     EKG 12-Lead      Current medicines are reviewed at length with the patient today. (+/- concerns) n/a The following changes have been made: n/a  Patient Instructions  NO MEDICATION CHANGES   SCHEDULE AT [facility] FIRST Your physician has requested that you have an echocardiogram. Echocardiography is a painless test that uses sound waves to create images of your heart. It provides your doctor with information about the size and shape of your heart and how well your heart's chambers and valves are working. This procedure takes approximately one hour. There are no restrictions for this procedure. SECOND Your physician has requested that you have a stress echocardiogram. For further information please visit [email or web address] Please follow instruction sheet as given.     Your physician recommends that you schedule a follow-up appointment in 1 MONTH WITH DR Lyfe Monger.      Studies Ordered:   Orders Placed This Encounter  Procedures  . EKG 12-Lead  . ECHOCARDIOGRAM COMPLETE  . ECHOCARDIOGRAM STRESS TEST      Bryan Lemma, M.D., M.S. Interventional Cardiologist   Pager # [phone] Phone # [phone] [address] [place], [place] [address]

## 2017-05-25 NOTE — Patient Instructions (Signed)
NO MEDICATION CHANGES   SCHEDULE AT [address] FIRST Your physician has requested that you have an echocardiogram. Echocardiography is a painless test that uses sound waves to create images of your heart. It provides your doctor with information about the size and shape of your heart and how well your heart's chambers and valves are working. This procedure takes approximately one hour. There are no restrictions for this procedure. SECOND Your physician has requested that you have a stress echocardiogram. For further information please visit [email or web address]. Please follow instruction sheet as given.     Your physician recommends that you schedule a follow-up appointment in 1 MONTH WITH DR HARDING.

## 2017-05-26 ENCOUNTER — Encounter: Payer: Self-pay | Admitting: Cardiology

## 2017-05-26 NOTE — Assessment & Plan Note (Signed)
Shortness breath with exertion could potentially be related to ischemic etiology.    Plan: Check 2D echocardiogram as well as echo stress test.

## 2017-05-26 NOTE — Assessment & Plan Note (Signed)
EKG with probably benign findings to suggest inferior Q waves.  He is concerned, and rightfully so I think is reasonable to check a 2D echo just to confirm no wall motion normality's.

## 2017-05-26 NOTE — Assessment & Plan Note (Signed)
Some quite odd symptoms.  In way there were quite classic the other conglomeration of symptoms and just the sudden onset without any real risk factors in a young gentleman very unusual. Since he is noticing some dyspnea associated with the cardiogram with chest discomfort, I would like to check a 2D echo and then do echo stress test to evaluate for any wall motion normality with exertion.

## 2017-06-07 ENCOUNTER — Telehealth (HOSPITAL_COMMUNITY): Payer: Self-pay | Admitting: *Deleted

## 2017-06-07 NOTE — Telephone Encounter (Signed)
Patient given detailed instructions per Stress Test Requisition Sheet for test on 06/16/17 at 2:30.Patient Notified to arrive 30 minutes early, and that it is imperative to arrive on time for appointment to keep from having the test rescheduled.  Patient verbalized understanding. Daneil DolinSharon S Brooks

## 2017-06-16 ENCOUNTER — Other Ambulatory Visit: Payer: Self-pay

## 2017-06-16 ENCOUNTER — Ambulatory Visit (HOSPITAL_BASED_OUTPATIENT_CLINIC_OR_DEPARTMENT_OTHER): Payer: BLUE CROSS/BLUE SHIELD

## 2017-06-16 ENCOUNTER — Ambulatory Visit (HOSPITAL_COMMUNITY): Payer: BLUE CROSS/BLUE SHIELD

## 2017-06-16 ENCOUNTER — Ambulatory Visit (HOSPITAL_COMMUNITY): Payer: BLUE CROSS/BLUE SHIELD | Attending: Cardiology

## 2017-06-16 DIAGNOSIS — R0602 Shortness of breath: Secondary | ICD-10-CM

## 2017-06-16 DIAGNOSIS — R079 Chest pain, unspecified: Secondary | ICD-10-CM | POA: Diagnosis not present

## 2017-06-16 DIAGNOSIS — R0600 Dyspnea, unspecified: Secondary | ICD-10-CM | POA: Insufficient documentation

## 2017-06-16 DIAGNOSIS — R9431 Abnormal electrocardiogram [ECG] [EKG]: Secondary | ICD-10-CM | POA: Insufficient documentation

## 2017-06-16 HISTORY — PX: OTHER SURGICAL HISTORY: SHX169

## 2017-06-24 ENCOUNTER — Ambulatory Visit: Payer: BLUE CROSS/BLUE SHIELD | Admitting: Cardiology

## 2017-06-24 ENCOUNTER — Encounter: Payer: Self-pay | Admitting: Cardiology

## 2017-06-24 DIAGNOSIS — R0602 Shortness of breath: Secondary | ICD-10-CM | POA: Diagnosis not present

## 2017-06-24 DIAGNOSIS — R000 Tachycardia, unspecified: Secondary | ICD-10-CM | POA: Diagnosis not present

## 2017-06-24 DIAGNOSIS — R079 Chest pain, unspecified: Secondary | ICD-10-CM

## 2017-06-24 DIAGNOSIS — F908 Attention-deficit hyperactivity disorder, other type: Secondary | ICD-10-CM | POA: Diagnosis not present

## 2017-06-24 NOTE — Patient Instructions (Addendum)
MEDICATION INSTRUCTIONS   RECOMMEND PRIMARY CHANGE FORMULARY OF ADDERALL TO BRAND OR PREVIOUS DOSING    Your physician wants you to follow-up in 4 MONTHS WITH DR HARDING. You will receive a reminder letter in the mail two months in advance. If you don't receive a letter, please call our office to schedule the follow-up appointment.

## 2017-06-24 NOTE — Progress Notes (Signed)
PCP: Ailene Ravel, MD  Clinic Note: Chief Complaint  Patient presents with  . Chest Pain    pt states sometimes he feels some pressure in his chest area  . Shortness of Breath    sometimes feels SOB     HPI: Thomas Bowers is a 36 y.o. male who is being seen today for follow-up evaluation of chest pain and shortness of breath at the request of Hamrick, Durward Fortes, MD.  Thomas Bowers was last seen on a couple occasions here in early November by his PCP.  Was seen on the fifth and 6 November.  Initially came in with a couple weeks of intermittent spells of tightness across the lower chest radiating to both legs associated with significant dyspnea.  He also notes palpitations. He was seen here on May 25, 2017 for initial evaluation of episodes of tightness and squeezing in the entire chest associated with shortness of breath.  He feels occasionally a sensation of his heart beating fast and irregular but just for short little spells.   Recent Hospitalizations: None  Studies Personally Reviewed - (if available, images/films reviewed: From Epic Chart or Care Everywhere) **June 16, 2017:  2D echo: Normal study.  Mild concentric LVH with normal wall motion and EF of 55-60%.  No regional wall motion normality.  Normal valves.  Stress echo: Excellent exercise tolerance -walked 12 minutes.  No chest pain.  No EKG changes to suggest ischemia.  No echocardiographic evidence of ischemia.  EF improved from 55-60 up to 70 and 75% with expected hyperdynamic response.  Interval History: [name] is a relatively healthy gentleman who is on Adderall for attention deficit but otherwise no real other history.  No family history of coronary disease.    He returns today still noticing having issues with fatigue feeling his heart rate going up and down.  He feels tired.  Had times throughout the day started feeling lightheaded sweating spells.  He says his energy levels go up and down difficulty with  concentration. He noted that he really did not have symptoms of rapid irregular heartbeats beyond may be 110 bpm on his watch.  Sundays when he is able to go all day and play with the baby no problems, other days he is exhausted, especially at the end of work.  Otherwise from a cardiac standpoint he is doing fairly well.  He really does not have any significant chest tightness pressure at rest or exertion.  He denies resting or exertional dyspnea except during the spells. He denies any PND, orthopnea or edema.  No syncope or near syncope.  No TIA or amaurosis fugax   ROS: A comprehensive was performed.  Most symptoms that are pertinent are noted above. Review of Systems  Constitutional: Positive for diaphoresis (Still has spells off and on) and malaise/fatigue.  HENT: Negative for congestion, nosebleeds and sinus pain.   Respiratory: Positive for shortness of breath. Negative for cough, wheezing and stridor.   Cardiovascular:       Per HPI  Gastrointestinal: Negative for blood in stool, heartburn and melena.  Genitourinary: Negative for dysuria, frequency, hematuria and urgency.  Musculoskeletal: Negative for falls, joint pain and myalgias.  Neurological: Positive for dizziness. Negative for weakness.  Endo/Heme/Allergies: Negative for environmental allergies. Does not bruise/bleed easily.  Psychiatric/Behavioral: The patient is nervous/anxious.        Somewhat stressful job  All other systems reviewed and are negative.   I have reviewed and (if needed) personally updated the patient's  problem list, medications, allergies, past medical and surgical history, social and family history.   Past Medical History:  Diagnosis Date  . Adult ADHD (attention deficit hyperactivity disorder)   . Allergy     Past Surgical History:  Procedure Laterality Date  . APPENDECTOMY    . SHOULDER SURGERY Left   . TRANSTHORACIC ECHOCARDIOGRAM  05/2017   Normal study.  Mild concentric LVH with normal wall  motion and EF of 55-60%.  No regional wall motion normality.  Normal valves.  . Treadmill Stress Echocardiogram  06/16/2017    Excellent exercise tolerance -walked 12 minutes.  No chest pain.  No EKG changes to suggest ischemia.  No echocardiographic evidence of ischemia.  EF improved from 55-60 up to 70 and 75% with expected hyperdynamic response.    Current Meds  Medication Sig  . Albuterol Sulfate 108 (90 Base) MCG/ACT AEPB Inhale 2 puffs as directed into the lungs.  Marland Kitchen. amphetamine-dextroamphetamine (ADDERALL) 30 MG tablet Take 1 tablet by mouth 2 (two) times daily.  . cetirizine (ZYRTEC) 10 MG tablet Take 10 mg daily by mouth.  . Cholecalciferol (VITAMIN D-1000 MAX ST) 1000 units tablet Take 1 tablet daily by mouth.  . cyanocobalamin (TH VITAMIN B12) 100 MCG tablet Take 1,200 mcg by mouth daily.   . DOCOSAHEXAENOIC ACID PO Take 1 g daily by mouth.  Bartholome Bill. EQL NATURAL ZINC 50 MG TABS Take 1 tablet daily by mouth.  . folic acid (FOLVITE) 800 MCG tablet Take 800 mg by mouth daily.   Marland Kitchen. pyridOXINE (B-6) 50 MG tablet Take 50 mg daily by mouth.  . vitamin C (ASCORBIC ACID) 500 MG tablet Take 500 mg by mouth daily.  . vitamin E 200 UNIT capsule Take 400 Units by mouth daily.   Marland Kitchen. zolpidem (AMBIEN) 10 MG tablet Take 10 mg by mouth as directed.    Allergies  Allergen Reactions  . Strawberry Extract Hives    Social History   Socioeconomic History  . Marital status: Married    Spouse name: None  . Number of children: None  . Years of education: None  . Highest education level: None  Social Needs  . Financial resource strain: None  . Food insecurity - worry: None  . Food insecurity - inability: None  . Transportation needs - medical: None  . Transportation needs - non-medical: None  Occupational History  . Occupation: HR Manager  Tobacco Use  . Smoking status: Never Smoker  . Smokeless tobacco: Never Used  Substance and Sexual Activity  . Alcohol use: Yes    Comment: occasional beer    . Drug use: No  . Sexual activity: Yes  Other Topics Concern  . None  Social History Narrative  . None    family history includes Arrhythmia in his mother; Rheumatic fever in his maternal grandmother.  Wt Readings from Last 3 Encounters:  06/24/17 262 lb 3.2 oz (118.9 kg)  05/25/17 259 lb 6.4 oz (117.7 kg)  01/09/15 263 lb 9.6 oz (119.6 kg)    PHYSICAL EXAM BP 116/79   Pulse 95   Ht 6\' 3"  (1.905 m)   Wt 262 lb 3.2 oz (118.9 kg)   SpO2 97%   BMI 32.77 kg/m  Physical Exam  Constitutional: He is oriented to person, place, and time. He appears well-developed and well-nourished. No distress (No acute distress, just seems anxious and intense).  HENT:  Head: Normocephalic and atraumatic.  Mouth/Throat: No oropharyngeal exudate.  Eyes: No scleral icterus.  Neck: Normal range  of motion. Neck supple. No JVD present. Carotid bruit is not present. No tracheal deviation present. No thyromegaly present.  Cardiovascular: Normal rate, regular rhythm and normal pulses.  No extrasystoles are present. PMI is not displaced. Exam reveals distant heart sounds. Exam reveals no gallop.  No murmur heard. Pulmonary/Chest: Effort normal and breath sounds normal. No respiratory distress. He has no wheezes. He has no rales.  Abdominal: Soft.  Musculoskeletal: Normal range of motion. He exhibits no edema or deformity.  Neurological: He is alert and oriented to person, place, and time. No cranial nerve deficit. Coordination normal.  Skin:  Seems flushed and sweaty.  His face and hair is sweaty from just walking into the clinic  Psychiatric: He has a normal mood and affect. His behavior is normal. Judgment and thought content normal.  Quite anxious.  Quite talkative.  Nursing note and vitals reviewed.    Adult ECG Report  Rate: 88;  Rhythm: normal sinus rhythm and Inferior Q waves in lead III are more prominent than in aVF, cannot exclude inferior MI, age undetermined.  Also poor R wave progression,  cannot rule out anterior MI, age undetermined; otherwise normal axis, intervals and durations and relatively stable EKG.  Narrative Interpretation: Stable when compared to PCP clinic EKGs.   Other studies Reviewed: Additional studies/ records that were reviewed today include:  Recent Labs: 05/24/2017  Na+ 142, K+ 4.2, Cl-   105, HCO3-   23, BUN 15, Cr 1.03, Glu 80, Ca2+ 9.0; AST 17, ALT 27, AlkP 61  CBC: W 5.4, H/H 16.4/47.2, Plt 220;; TSH 1.43.  A1c 4.9  TC 187, TG 141, HDL 41, LDL 118 (not fasting)   ASSESSMENT / PLAN: Problem List Items Addressed This Visit    ADD (attention deficit disorder)    Despite this being a relatively simple visit to discuss the results of tests that were normal, the patient spent well over 30 minutes discussing his flushing sensations and concentration issues.  In addition, onset of symptoms correlates with changing over to a new formulation of his Adderall.  It is a different appearing pill than he been using before.  I wonder if this could be some issues with the formulation affecting his complication I recommended that he actually switched to brand name Adderall versus different formulation to see if it the Adderall itself causing his problems.      Chest pain with low risk for cardiac etiology    Not unexpectedly, he is a stress echo was normal.  This was reassuring for him as was the echocardiogram. Most likely musculoskeletal.      Racing heart beat    He still has spells where he feels his heart skipping for flopping.   The frequency of these episodes is probably not enough for us to capture on 2-week monitor.  I think is probably better simply following his heart rates on his own.  He has irregularities, we can suggest applications that he can use to monitor his rhythms, but can also order a monitor at that time.      Shortness of breath    Suspect that this is probably related to deconditioning and weight.  Normal stress echo and normal echo.           Current medicines are reviewed at length with the patient today. (+/- concerns) n/a The following changes have been made: n/a  Patient Instructions  MEDICATION INSTRUCTIONS   RECOMMEND PRIMARY CHANGE FORMULARY OF ADDERALL TO BRAND OR PREVIOUS DOSING  Your physician wants you to follow-up in 4 MONTHS WITH DR HARDING. You will receive a reminder letter in the mail two months in advance. If you don't receive a letter, please call our office to schedule the follow-up appointment.    Studies Ordered:   No orders of the defined types were placed in this encounter.     Bryan Lemma, M.D., M.S. Interventional Cardiologist   Pager # [phone] Phone # [phone] [address] [place], [place] [address]

## 2017-06-26 ENCOUNTER — Encounter: Payer: Self-pay | Admitting: Cardiology

## 2017-06-26 DIAGNOSIS — R000 Tachycardia, unspecified: Secondary | ICD-10-CM | POA: Insufficient documentation

## 2017-06-26 NOTE — Assessment & Plan Note (Signed)
He still has spells where he feels his heart skipping for flopping.   The frequency of these episodes is probably not enough for us to capture on 2-week monitor.  I think is probably better simply following his heart rates on his own.  He has irregularities, we can suggest applications that he can use to monitor his rhythms, but can also order a monitor at that time.

## 2017-06-26 NOTE — Assessment & Plan Note (Signed)
Not unexpectedly, he is a stress echo was normal.  This was reassuring for him as was the echocardiogram. Most likely musculoskeletal.

## 2017-06-26 NOTE — Assessment & Plan Note (Signed)
Suspect that this is probably related to deconditioning and weight.  Normal stress echo and normal echo.

## 2017-06-26 NOTE — Assessment & Plan Note (Addendum)
Despite this being a relatively simple visit to discuss the results of tests that were normal, the patient spent well over 30 minutes discussing his flushing sensations and concentration issues.  In addition, onset of symptoms correlates with changing over to a new formulation of his Adderall.  It is a different appearing pill than he been using before.  I wonder if this could be some issues with the formulation affecting his complication I recommended that he actually switched to brand name Adderall versus different formulation to see if it the Adderall itself causing his problems.

## 2017-08-15 DIAGNOSIS — F900 Attention-deficit hyperactivity disorder, predominantly inattentive type: Secondary | ICD-10-CM | POA: Diagnosis not present

## 2017-12-06 ENCOUNTER — Encounter: Payer: Self-pay | Admitting: Cardiology

## 2017-12-06 ENCOUNTER — Ambulatory Visit: Payer: BLUE CROSS/BLUE SHIELD | Admitting: Cardiology

## 2017-12-06 VITALS — BP 126/80 | HR 84 | Ht 76.0 in | Wt 265.0 lb

## 2017-12-06 DIAGNOSIS — R079 Chest pain, unspecified: Secondary | ICD-10-CM | POA: Diagnosis not present

## 2017-12-06 DIAGNOSIS — R000 Tachycardia, unspecified: Secondary | ICD-10-CM

## 2017-12-06 DIAGNOSIS — R0602 Shortness of breath: Secondary | ICD-10-CM

## 2017-12-06 NOTE — Progress Notes (Deleted)
PCP: Ailene Ravel, MD  Clinic Note: No chief complaint on file.   HPI: Thomas Bowers is a 37 y.o. male who is being seen today for 6 month follow-up evaluation -- initially seen with c/o chest pain and shortness of breath at the request of Hamrick, Durward Fortes, MD.  Thomas Bowers was last seen on a couple occasions here in early November by his PCP.  Was seen on the fifth and 6 November.  Initially came in with a couple weeks of intermittent spells of tightness across the lower chest radiating to both legs associated with significant dyspnea.  He also notes palpitations. He was seen here on May 25, 2017 for initial evaluation of episodes of tightness and squeezing in the entire chest associated with shortness of breath.  He feels occasionally a sensation of his heart beating fast and irregular but just for short little spells.   Recent Hospitalizations: None  Studies Personally Reviewed - (if available, images/films reviewed: From Epic Chart or Care Everywhere) **June 16, 2017:  2D echo: Normal study.  Mild concentric LVH with normal wall motion and EF of 55-60%.  No regional wall motion normality.  Normal valves.  Stress echo: Excellent exercise tolerance -walked 12 minutes.  No chest pain.  No EKG changes to suggest ischemia.  No echocardiographic evidence of ischemia.  EF improved from 55-60 up to 70 and 75% with expected hyperdynamic response.  Interval History: Thomas is a relatively healthy gentleman who is on Adderall for attention deficit but otherwise no real other history.  No family history of coronary disease.    He returns today still noticing having issues with fatigue feeling his heart rate going up and down.  He feels tired.  Had times throughout the day started feeling lightheaded sweating spells.  He says his energy levels go up and down difficulty with concentration. He noted that he really did not have symptoms of rapid irregular heartbeats beyond may be 110 bpm on  his watch.  Sundays when he is able to go all day and play with the baby no problems, other days he is exhausted, especially at the end of work.  Otherwise from a cardiac standpoint he is doing fairly well.  He really does not have any significant chest tightness pressure at rest or exertion.  He denies resting or exertional dyspnea except during the spells. He denies any PND, orthopnea or edema.  No syncope or near syncope.  No TIA or amaurosis fugax   ROS: A comprehensive was performed.  Most symptoms that are pertinent are noted above. Review of Systems  Constitutional: Positive for diaphoresis (Still has spells off and on) and malaise/fatigue.  HENT: Negative for congestion, nosebleeds and sinus pain.   Respiratory: Positive for shortness of breath. Negative for cough, wheezing and stridor.   Cardiovascular:       Per HPI  Gastrointestinal: Negative for blood in stool, heartburn and melena.  Genitourinary: Negative for dysuria, frequency, hematuria and urgency.  Musculoskeletal: Negative for falls, joint pain and myalgias.  Neurological: Positive for dizziness. Negative for weakness.  Endo/Heme/Allergies: Negative for environmental allergies. Does not bruise/bleed easily.  Psychiatric/Behavioral: The patient is nervous/anxious.        Somewhat stressful job  All other systems reviewed and are negative.   I have reviewed and (if needed) personally updated the patient's problem list, medications, allergies, past medical and surgical history, social and family history.   Past Medical History:  Diagnosis Date  . Adult ADHD (attention  deficit hyperactivity disorder)   . Allergy     Past Surgical History:  Procedure Laterality Date  . APPENDECTOMY    . SHOULDER SURGERY Left   . TRANSTHORACIC ECHOCARDIOGRAM  05/2017   Normal study.  Mild concentric LVH with normal wall motion and EF of 55-60%.  No regional wall motion normality.  Normal valves.  . Treadmill Stress Echocardiogram   06/16/2017    Excellent exercise tolerance -walked 12 minutes.  No chest pain.  No EKG changes to suggest ischemia.  No echocardiographic evidence of ischemia.  EF improved from 55-60 up to 70 and 75% with expected hyperdynamic response.    Current Meds  Medication Sig  . amphetamine-dextroamphetamine (ADDERALL) 30 MG tablet Take 1 tablet by mouth 2 (two) times daily.  . cetirizine (ZYRTEC) 10 MG tablet Take 10 mg daily by mouth.  . Cholecalciferol (VITAMIN D-1000 MAX ST) 1000 units tablet Take 1 tablet daily by mouth.  . cyanocobalamin (TH VITAMIN B12) 100 MCG tablet Take 1,200 mcg by mouth daily.   . DOCOSAHEXAENOIC ACID PO Take 1 g daily by mouth.  Bartholome Bill NATURAL ZINC 50 MG TABS Take 1 tablet daily by mouth.  . folic acid (FOLVITE) 800 MCG tablet Take 800 mg by mouth daily.   Marland Kitchen pyridOXINE (B-6) 50 MG tablet Take 50 mg daily by mouth.  . vitamin C (ASCORBIC ACID) 500 MG tablet Take 500 mg by mouth daily.  . vitamin E 200 UNIT capsule Take 400 Units by mouth daily.   Marland Kitchen zolpidem (AMBIEN) 10 MG tablet Take 10 mg by mouth as directed.    Allergies  Allergen Reactions  . Strawberry Extract Hives    Social History   Socioeconomic History  . Marital status: Married    Spouse name: Not on file  . Number of children: Not on file  . Years of education: Not on file  . Highest education level: Not on file  Occupational History  . Occupation: HR Manager  Social Needs  . Financial resource strain: Not on file  . Food insecurity:    Worry: Not on file    Inability: Not on file  . Transportation needs:    Medical: Not on file    Non-medical: Not on file  Tobacco Use  . Smoking status: Never Smoker  . Smokeless tobacco: Never Used  Substance and Sexual Activity  . Alcohol use: Yes    Comment: occasional beer  . Drug use: No  . Sexual activity: Yes  Lifestyle  . Physical activity:    Days per week: Not on file    Minutes per session: Not on file  . Stress: Not on file    Relationships  . Social connections:    Talks on phone: Not on file    Gets together: Not on file    Attends religious service: Not on file    Active member of club or organization: Not on file    Attends meetings of clubs or organizations: Not on file    Relationship status: Not on file  Other Topics Concern  . Not on file  Social History Narrative  . Not on file    family history includes Arrhythmia in his mother; Rheumatic fever in his maternal grandmother.  Wt Readings from Last 3 Encounters:  12/06/17 265 lb (120.2 kg)  06/24/17 262 lb 3.2 oz (118.9 kg)  05/25/17 259 lb 6.4 oz (117.7 kg)    PHYSICAL EXAM BP 126/80   Pulse 84   Ht  $RemoveB 93 m)   Wt 265 lb (120.2 kg)   BMI 32.26 kg/m  Physical Exam  Constitutional: He is oriented to person, place, and time. He appears well-developed and well-nourished. No distress (No acute distress, just seems anxious and intense).  HENT:  Head: Normocephalic and atraumatic.  Mouth/Throat: No oropharyngeal exudate.  Eyes: No scleral icterus.  Neck: Normal range of motion. Neck supple. No JVD present. Carotid bruit is not present. No tracheal deviation present. No thyromegaly present.  Cardiovascular: Normal rate, regular rhythm and normal pulses.  No extrasystoles are present. PMI is not displaced. Exam reveals distant heart sounds. Exam reveals no gallop.  No murmur heard. Pulmonary/Chest: Effort normal and breath sounds normal. No respiratory distress. He has no wheezes. He has no rales.  Abdominal: Soft.  Musculoskeletal: Normal range of motion. He exhibits no edema or deformity.  Neurological: He is alert and oriented to person, place, and time. No cranial nerve deficit. Coordination normal.  Skin:  Seems flushed and sweaty.  His face and hair is sweaty from just walking into the clinic  Psychiatric: He has a normal mood and affect. His behavior is normal. Judgment and thought content normal.  Quite anxious.  Quite talkative.   Nursing note and vitals reviewed.    Adult ECG Report  Rate: 88;  Rhythm: normal sinus rhythm and Inferior Q waves in lead III are more prominent than in aVF, cannot exclude inferior MI, age undetermined.  Also poor R wave progression, cannot rule out anterior MI, age undetermined; otherwise normal axis, intervals and durations and relatively stable EKG.  Narrative Interpretation: Stable when compared to PCP clinic EKGs.   Other studies Reviewed: Additional studies/ records that were reviewed today include:  Recent Labs: 05/24/2017  Na+ 142, K+ 4.2, Cl-   105, HCO3-   23, BUN 15, Cr 1.03, Glu 80, Ca2+ 9.0; AST 17, ALT 27, AlkP 61  CBC: W 5.4, H/H 16.4/47.2, Plt 220;; TSH 1.43.  A1c 4.9  TC 187, TG 141, HDL 41, LDL 118 (not fasting)   ASSESSMENT / PLAN: Problem List Items Addressed This Visit    None      Current medicines are reviewed at length with the patient today. (+/- concerns) n/a The following changes have been made: n/a  There are no Patient Instructions on file for this visit.Studies Ordered:   No orders of the defined types were placed in this encounter.     Bryan Lemma, M.D., M.S. Interventional Cardiologist   Pager # [phone] Phone # [phone] [address] [place], [place] [address]

## 2017-12-06 NOTE — Patient Instructions (Signed)
NO CHANGES   Your physician recommends that you schedule a follow-up appointment on as needed basis.

## 2017-12-07 ENCOUNTER — Encounter: Payer: Self-pay | Admitting: Cardiology

## 2017-12-07 NOTE — Progress Notes (Signed)
PCP: Ailene Ravel, MD  Clinic Note: Chief Complaint  Patient presents with  . Follow-up    No complaints - feeling well    HPI: Thomas Bowers is a 37 y.o. male with a PMH below who presents today for six-month follow-up after initially being evaluated for chest pain, shortness of breath and palpitations. He is on Adderal for Adult ADD.   I initially saw him on May 25, 2017 for evaluation of episode and symptoms of squeezing in his chest with tightness.  He also noted heart racing and flushing sensations.  He would feel quite weak and lethargic afterwards.  Episodes would last several minutes at a time and were not associated with any particular activity.  -   For ischemic evaluation, we checked an echocardiogram and echo stress test - both normal  Slayter Moorhouse was last seen on June 24, 2017 -he was still noticing the symptoms -most notable and flushing sensation heartbeat spells.  He also noted feeling exhausted.Marland Kitchen  He thought this may have been related to a change in his Adderall formulation. --My recommendation was to address his PCP switching back the tradename Adderall  Recent Hospitalizations: none  Studies Personally Reviewed - (if available, images/films reviewed: From Epic Chart or Care Everywhere)  none  Interval History: Remarkably even returns here today having switched back to a different formulation of Adderall and feels wonderful.  He has not had any further of the episodes that he was noticing prior to our last visit.  He was able to tolerate the entire stressful month of December and January at work without any issues concerning for anxiety attacks.  No chest pain or palpitations.  No flushing.    Essentially negative cardiac review of symptoms:  No chest pain or shortness of breath with rest or exertion. No PND, orthopnea or edema. No palpitations, lightheadedness, dizziness, weakness or syncope/near syncope. No TIA/amaurosis fugax symptoms. No melena,  hematochezia, hematuria, or epstaxis. No claudication.  ROS: All review of symptoms noted above.  Otherwise he is been doing well with no GI  No pulmonary complaints.  I have reviewed and (if needed) personally updated the patient's problem list, medications, allergies, past medical and surgical history, social and family history.   Past Medical History:  Diagnosis Date  . Adult ADHD (attention deficit hyperactivity disorder)   . Allergy     Past Surgical History:  Procedure Laterality Date  . APPENDECTOMY    . SHOULDER SURGERY Left   . TRANSTHORACIC ECHOCARDIOGRAM  05/2017   Normal study.  Mild concentric LVH with normal wall motion and EF of 55-60%.  No regional wall motion normality.  Normal valves.  . Treadmill Stress Echocardiogram  06/16/2017    Excellent exercise tolerance -walked 12 minutes.  No chest pain.  No EKG changes to suggest ischemia.  No echocardiographic evidence of ischemia.  EF improved from 55-60 up to 70 and 75% with expected hyperdynamic response.    Current Meds  Medication Sig  . amphetamine-dextroamphetamine (ADDERALL) 30 MG tablet Take 1 tablet by mouth 2 (two) times daily.  . cetirizine (ZYRTEC) 10 MG tablet Take 10 mg daily by mouth.  . Cholecalciferol (VITAMIN D-1000 MAX ST) 1000 units tablet Take 1 tablet daily by mouth.  . cyanocobalamin (TH VITAMIN B12) 100 MCG tablet Take 1,200 mcg by mouth daily.   . DOCOSAHEXAENOIC ACID PO Take 1 g daily by mouth.  Bartholome Bill NATURAL ZINC 50 MG TABS Take 1 tablet daily by mouth.  . folic  acid (FOLVITE) 800 MCG tablet Take 800 mg by mouth daily.   Marland Kitchen pyridOXINE (B-6) 50 MG tablet Take 50 mg daily by mouth.  . vitamin C (ASCORBIC ACID) 500 MG tablet Take 500 mg by mouth daily.  . vitamin E 200 UNIT capsule Take 400 Units by mouth daily.   Marland Kitchen zolpidem (AMBIEN) 10 MG tablet Take 10 mg by mouth as directed.    Allergies  Allergen Reactions  . Strawberry Extract Hives    Social History   Tobacco Use  . Smoking  status: Never Smoker  . Smokeless tobacco: Never Used  Substance Use Topics  . Alcohol use: Yes    Comment: occasional beer  . Drug use: No   Social History   Social History Narrative  . Not on file    family history includes Arrhythmia in his mother; Rheumatic fever in his maternal grandmother.  Wt Readings from Last 3 Encounters:  12/06/17 265 lb (120.2 kg)  06/24/17 262 lb 3.2 oz (118.9 kg)  05/25/17 259 lb 6.4 oz (117.7 kg)    PHYSICAL EXAM BP 126/80   Pulse 84   Ht  (1.93 m)   Wt 265 lb (120.2 kg)   BMI 32.26 kg/m   Physical Exam  Constitutional: He is oriented to person, place, and time. He appears well-developed and well-nourished. No distress.  Healthy-appearing.  Mildly obese.  Well-groomed  HENT:  Head: Normocephalic and atraumatic.  Cardiovascular: Normal rate, regular rhythm, normal heart sounds and intact distal pulses. Exam reveals no gallop and no friction rub.  No murmur heard. Pulmonary/Chest: Effort normal and breath sounds normal. No respiratory distress. He has no wheezes. He has no rales.  Musculoskeletal: Normal range of motion. He exhibits no edema.  Neurological: He is alert and oriented to person, place, and time.  Psychiatric: He has a normal mood and affect. His behavior is normal. Judgment and thought content normal.  Vitals reviewed.    Adult ECG Report  n/a   Other studies Reviewed: Additional studies/ records that were reviewed today include:  Recent Labs:  n/a     ASSESSMENT / PLAN: Problem List Items Addressed This Visit    Shortness of breath   Racing heart beat   Chest pain with low risk for cardiac etiology - Primary     Essentially, all these problems seem to have disappeared since changing back to his original Adderall formulation.  He is not having any more of the chest discomfort racing heartbeats or dyspnea symptoms.  He is quite happy with the change of symptoms.  He was able to tolerate a very stressful  situation without any difficulty.  At this point, I think he is relatively stable from a cardiac standpoint he can follow-up on an as-needed basis.  He has had a relatively normal cardiac evaluation and is currently asymptomatic  Current medicines are reviewed at length with the patient today.  (+/- concerns) n/a The following changes have been made:  n/a  Patient Instructions  NO CHANGES   Your physician recommends that you schedule a follow-up appointment on as needed basis.     Studies Ordered:   No orders of the defined types were placed in this encounter.     Bryan Lemma, M.D., M.S. Interventional Cardiologist   Pager # [phone] Phone # [phone] [address] [place], [place] [address]   Thank you for choosing [facility]!!

## 2018-11-28 IMAGING — CR DG FOOT COMPLETE 3+V*L*
3 series · 3 of 3 positions shown · non-contrast
Comparison: No recent.

CLINICAL DATA: Injury.

EXAM:
LEFT FOOT - COMPLETE 3+ VIEW

[x foot ap left]
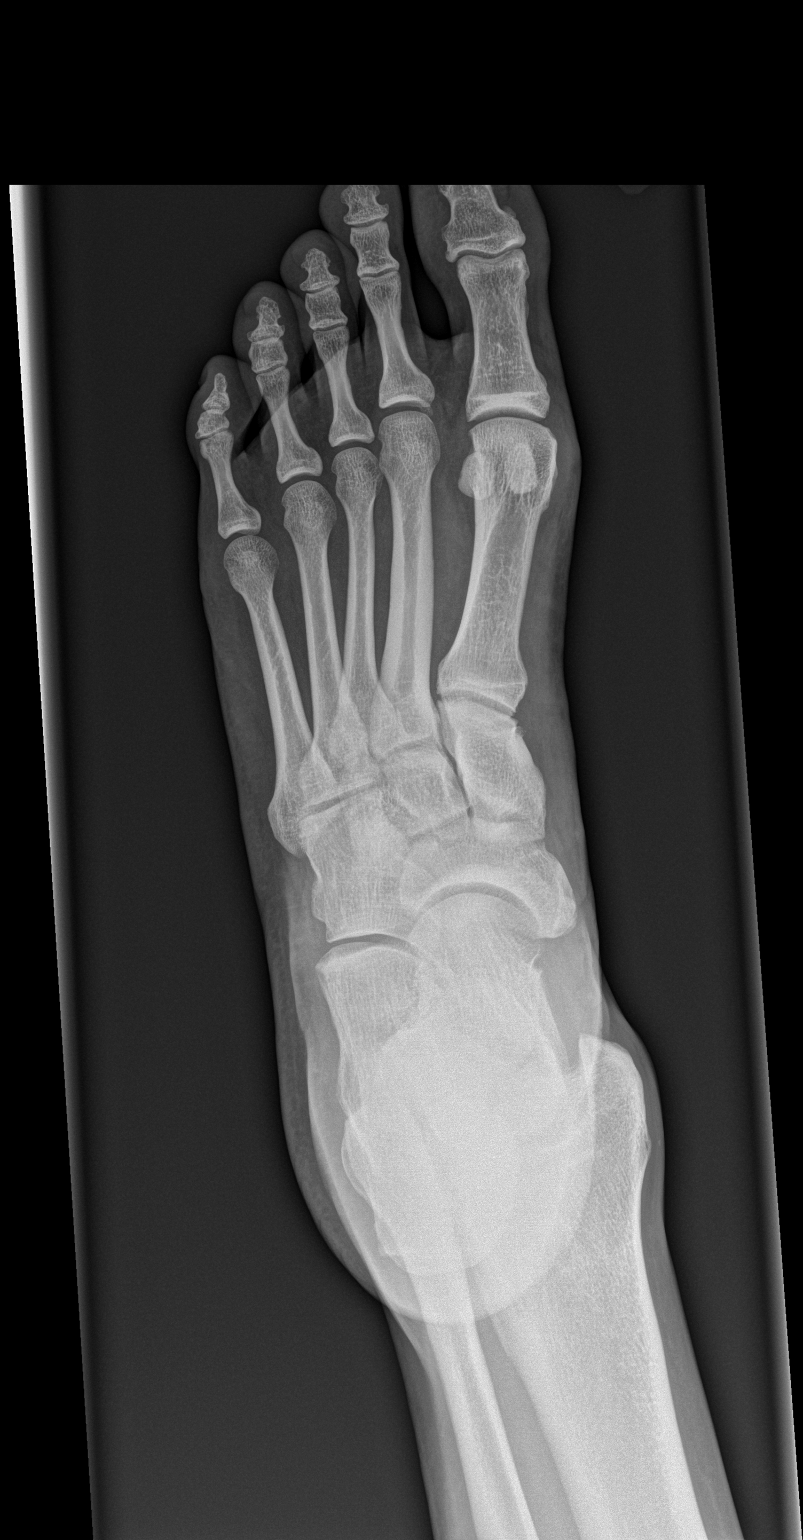

[x foot obl left]
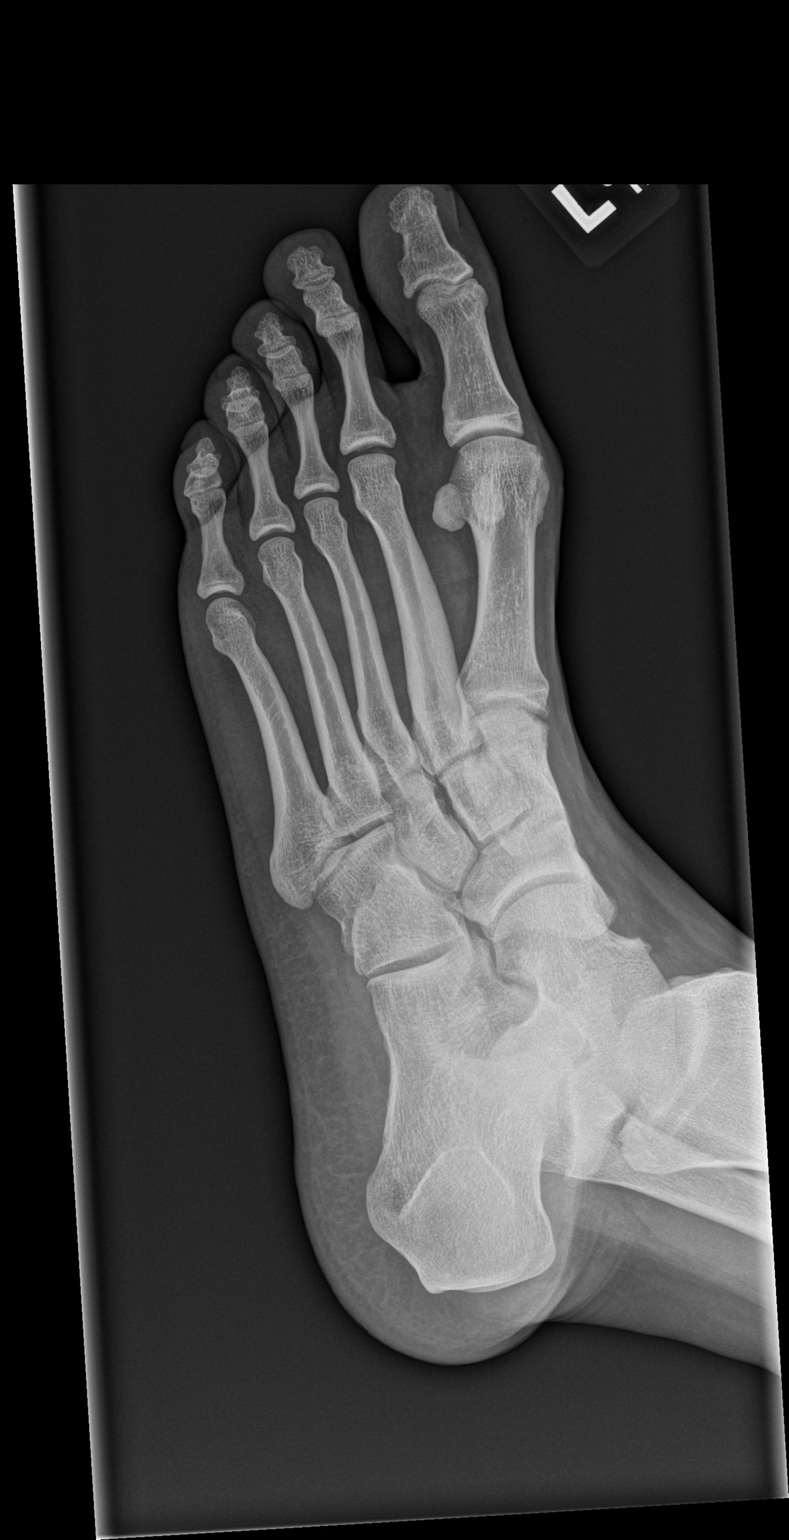

[x foot lat left]
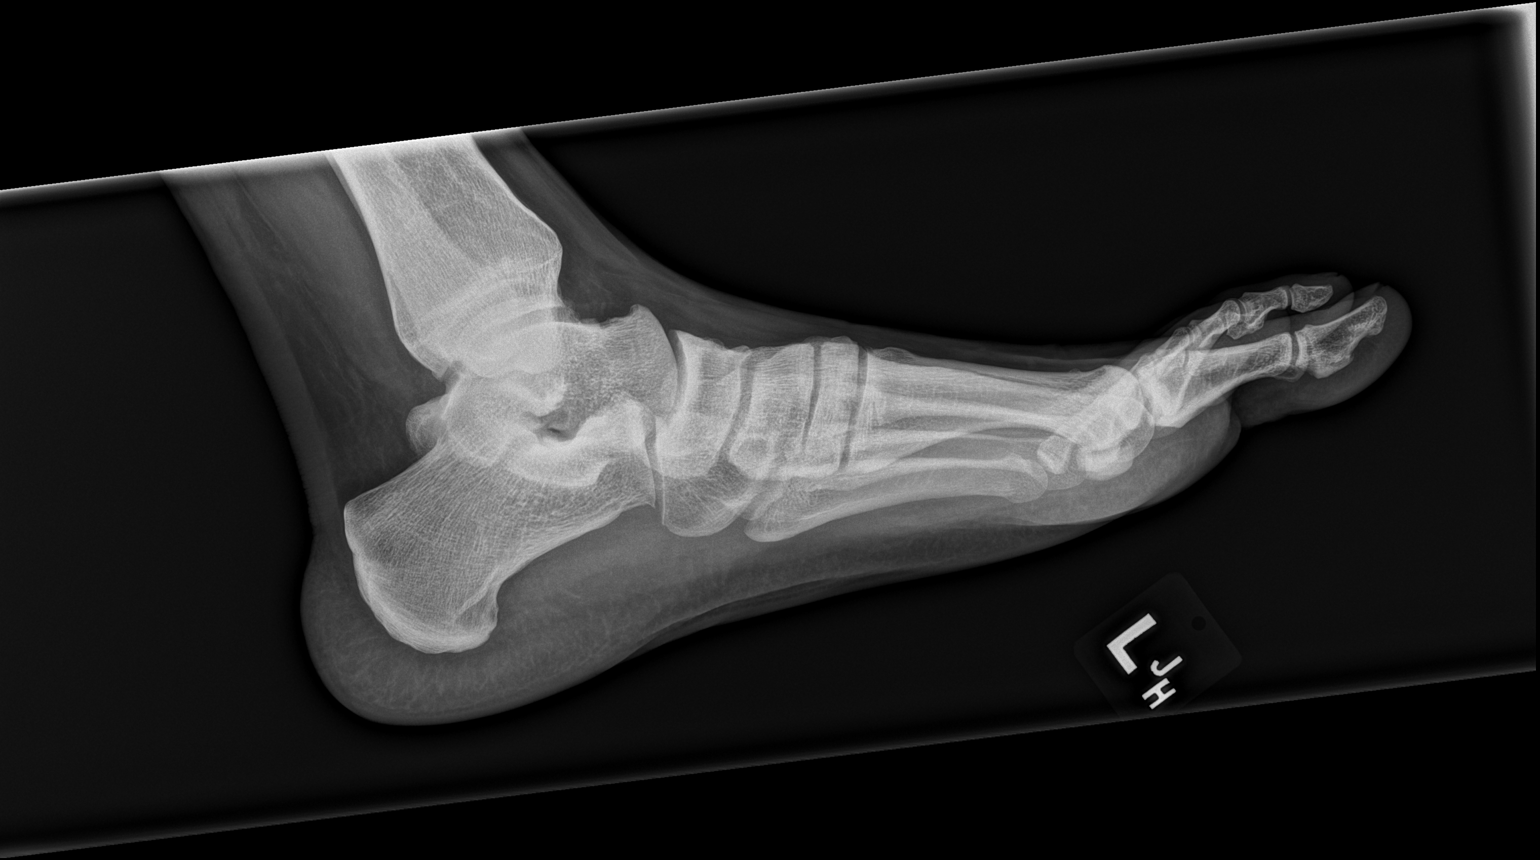

[3 of 3 positions shown; findings below may reference images not displayed]

FINDINGS: No acute bony or joint abnormality identified. No evidence of
fracture or dislocation.
IMPRESSION: No acute abnormality.

## 2019-01-04 DIAGNOSIS — Z1159 Encounter for screening for other viral diseases: Secondary | ICD-10-CM | POA: Diagnosis not present

## 2019-01-04 DIAGNOSIS — M791 Myalgia, unspecified site: Secondary | ICD-10-CM | POA: Diagnosis not present

## 2019-04-18 DIAGNOSIS — S0502XA Injury of conjunctiva and corneal abrasion without foreign body, left eye, initial encounter: Secondary | ICD-10-CM | POA: Diagnosis not present

## 2019-04-23 DIAGNOSIS — S0502XD Injury of conjunctiva and corneal abrasion without foreign body, left eye, subsequent encounter: Secondary | ICD-10-CM | POA: Diagnosis not present

## 2019-05-01 ENCOUNTER — Other Ambulatory Visit: Payer: Self-pay

## 2019-05-01 DIAGNOSIS — Z20822 Contact with and (suspected) exposure to covid-19: Secondary | ICD-10-CM

## 2019-05-01 DIAGNOSIS — Z20828 Contact with and (suspected) exposure to other viral communicable diseases: Secondary | ICD-10-CM | POA: Diagnosis not present

## 2019-05-03 ENCOUNTER — Telehealth: Payer: Self-pay | Admitting: Family Medicine

## 2019-05-03 LAB — NOVEL CORONAVIRUS, NAA: SARS-CoV-2, NAA: NOT DETECTED

## 2019-05-03 NOTE — Telephone Encounter (Signed)
Patient called for Covid results.  He was told that Covid was Not Detected.
